# Patient Record
Sex: Female | Born: 1975 | Race: White | Hispanic: No | Marital: Married | Smoking: Never smoker
Health system: Southern US, Community
[De-identification: ages and names within clinical notes are randomized; demographics above are authoritative.]

## PROBLEM LIST (undated history)

## (undated) DIAGNOSIS — Z973 Presence of spectacles and contact lenses: Secondary | ICD-10-CM

## (undated) HISTORY — PX: NO PAST SURGERIES: SHX2092

---

## 1999-12-11 ENCOUNTER — Other Ambulatory Visit: Payer: Self-pay

## 1999-12-11 ENCOUNTER — Ambulatory Visit (HOSPITAL_COMMUNITY): Payer: Self-pay | Admitting: Obstetrics & Gynecology

## 2000-03-20 ENCOUNTER — Ambulatory Visit (HOSPITAL_COMMUNITY): Payer: Self-pay

## 2000-03-20 ENCOUNTER — Other Ambulatory Visit: Payer: Self-pay

## 2000-04-09 ENCOUNTER — Ambulatory Visit (HOSPITAL_COMMUNITY): Payer: Self-pay | Admitting: Obstetrics & Gynecology

## 2003-05-28 ENCOUNTER — Other Ambulatory Visit: Admission: RE | Admit: 2003-05-28 | Discharge: 2003-05-28 | Payer: Self-pay | Admitting: Obstetrics and Gynecology

## 2004-03-15 ENCOUNTER — Emergency Department (HOSPITAL_COMMUNITY): Admission: EM | Admit: 2004-03-15 | Discharge: 2004-03-15 | Payer: Self-pay | Admitting: Emergency Medicine

## 2004-08-01 ENCOUNTER — Other Ambulatory Visit: Admission: RE | Admit: 2004-08-01 | Discharge: 2004-08-01 | Payer: Self-pay | Admitting: Obstetrics and Gynecology

## 2005-01-11 ENCOUNTER — Other Ambulatory Visit: Admission: RE | Admit: 2005-01-11 | Discharge: 2005-01-11 | Payer: Self-pay | Admitting: Obstetrics and Gynecology

## 2007-09-09 ENCOUNTER — Encounter (INDEPENDENT_AMBULATORY_CARE_PROVIDER_SITE_OTHER): Payer: Self-pay | Admitting: Obstetrics and Gynecology

## 2007-09-09 ENCOUNTER — Inpatient Hospital Stay (HOSPITAL_COMMUNITY): Admission: AD | Admit: 2007-09-09 | Discharge: 2007-09-12 | Payer: Self-pay | Admitting: Obstetrics and Gynecology

## 2010-07-25 NOTE — Op Note (Signed)
NAMEJENNA, [name]                 ACCOUNT NO.:  [id number]   MEDICAL RECORD NO.:  [id number]          PATIENT TYPE:  INP   LOCATION:  9129                          FACILITY:  [facility]   PHYSICIAN:  Guy Sandifer. Henderson Cloud, M.D. DATE OF BIRTH:  Jun 13, 1975   DATE OF PROCEDURE:  09/09/2007  DATE OF DISCHARGE:                               OPERATIVE REPORT   PREOPERATIVE DIAGNOSIS:  Arrest of descent.   POSTOPERATIVE DIAGNOSIS:  Arrest of descent.   PROCEDURE:  Repeat low transverse cesarean section.   SURGEON:  Guy Sandifer. Henderson Cloud, MD   ANESTHESIA:  Epidural, Raul Del, MD   SPECIMENS:  Placenta to pathology.   FINDINGS:  Viable female infant, Apgars of 9 and 9 at 1 and 5 minutes  respectively.  Birth weight pending.  Arterial cord pH 7.31.   ESTIMATED BLOOD LOSS:  900 mL.   INDICATIONS AND CONSENT:  This patient is a 35 year old married white  female G2, P38 with an EDC of September 07, 2007, who is admitted on the  evening of September 08, 2007, for a induction of labor and a trial of labor  after C-section.  Prenatal care was complicated by previous low  transverse C-section for twins.  Group B strep culture was negative.  The patient progressed to complete and pushing.  She was pushing for  greater than one and half hours.  The vertex was ROA and did not descend  below +1 station.  After discussion of options, she was taken to  cesarean section.  Potential risks and complications were discussed  preoperatively including but not limited to infection, organ damage,  bleeding requiring transfusion of blood products with possible  transfusion reaction, HIV and hepatitis acquisition, DVT, PE, and  pneumonia.  All questions were answered and consent is signed on the  chart.   PROCEDURE:  The patient taken to operating room where she is identified,  epidural anesthetic is augmented to surgical level, and she is placed in  a dorsal supine position with a 15-degree left lateral wedge.  Foley  catheter is in place.  She is prepped and draped in the sterile fashion.  After testing for adequate epidural anesthesia, skin is entered through  the previous Pfannenstiel scar taking the scar out on the way in.  Dissection is carried out in layers to the peritoneum.  Peritoneum is  incised and extended superiorly and inferiorly.  The bladder is adherent  by the third of the way up on the lower uterine segment.  It is taken  down carefully without difficulty.  The lower uterine segment is fairly  thin.  The uterus is incised in a low transverse manner, and the uterine  cavity is entered bluntly.  The uterine incision is extended  cephalolaterally with the fingers.  The vertex then delivered without  difficulty and the baby is delivered.  Oronasopharynx were suctioned.  Good cry and tone is noted.  Cord is clamped and cut, and baby was  handed to the awaiting pediatrics team.  Placenta is delivered and sent  to pathology.  Uterine cavity is clean.  The lower uterine segment is  quite thin.  The uterus is closed in a running locking manner with 0  Monocryl suture, which achieves good hemostasis.  The uterus is  exteriorized and a figure-of-eight is carefully placed on the left angle  of the incision with careful palpation behind the broad ligament during  this process to protect other structures.  Good hemostasis is obtained.  Tubes and ovaries appear normal.  Uterus is replaced into the peritoneal  cavity.  Copious irrigation is carried out and all returns is clear.  Anterior peritoneum is closed in running fashion with 0 Monocryl suture,  which is also used to reapproximate the pyramidalis muscle in the  midline.  Rectus fascia is closed with a 0 PDS suture starting at each  angle and meeting in the middle.  The subcutaneous tissue is first  mobilized with a cautery to takedown the old scar and then the  subcutaneous layer is closed with interrupted 2-0 plain suture.  Skin is  closed with  clips.  All sponge, instruments, and needle counts were  correct, and the patient is transferred to recovery room in stable  condition.      Guy Sandifer Henderson Cloud, M.D.  Electronically Signed     JET/MEDQ  D:  09/09/2007  T:  09/10/2007  Job:  [id number]

## 2010-07-28 NOTE — Discharge Summary (Signed)
NAMEDYNESHA, WOOLEN                 ACCOUNT NO.:  [id number]   MEDICAL RECORD NO.:  [id number]          PATIENT TYPE:  INP   LOCATION:  9129                          FACILITY:  [facility]   PHYSICIAN:  Michelle L. Grewal, M.D.DATE OF BIRTH:  November 11, 1975   DATE OF ADMISSION:  09/09/2007  DATE OF DISCHARGE:  09/12/2007                               DISCHARGE SUMMARY   ADMITTING DIAGNOSES:  1. Intrauterine pregnancy at term.  2. Spontaneous onset of labor.  3. Previous cesarean section for twins, desires vaginal birth after      cesarean.   DISCHARGE DIAGNOSES:  1. Status post low transverse cesarean section secondary to arrest of      descent.  2. Viable female infant.   PROCEDURE:  Repeat low transverse cesarean section.   REASON FOR ADMISSION:  Please see written H&P.   HOSPITAL COURSE:  The patient is a 35 year old white married female  gravida 2, para 1 that was admitted to [facility] for a  VBAC.  The patient had had a previous cesarean delivery secondary to  twin delivery.  Group B beta strep culture was negative.  On admission,  cervix was dilated 2 cm, 80% effaced, vertex at -3 station.  Contractions were approximately every 3-5 minutes.  Fetal heart tones  continued to be reactive.  Intrauterine pressure catheter was placed and  the patient was started on low-dose Pitocin protocol for augmentation of  her labor.  The patient did progress to 8 cm completely effaced and 0 to  -1 station.  Shortly thereafter she did achieve full dilatation and  after pushing for approximately 1-1/2 hours with no further progress in  station, decision was made to proceed with a primary low transverse  cesarean section.  The patient was then transferred to the operating  room where epidural was dosed to an adequate surgical level.  A low  transverse incision was made with delivery of a viable female infant  weighing 7 pounds 3 ounces with Apgars of 9 at 1 minute and 9 at 5  minutes.   Arterial cord pH of 7.31.  The patient tolerated procedure  well and was taken to the recovery room in stable condition.  On  postoperative day #1, the patient was without complaint.  Vital signs  were stable.  She was afebrile.  Fundus firm and nontender.  Abdominal  dressing was noted to be clean, dry, and intact.  Laboratory findings  revealed hemoglobin 11.2, platelet count 182,000, WBC count of 15.4.  On  postoperative day #2, the patient was without complaint.  Vital signs  remained stable.  She was afebrile.  Fundus firm and nontender.  Incision was clean, dry, and intact.  She was ambulating well and  voiding without difficulty.  On postoperative day #3, the patient was  without complaint.  Vital signs remained stable.  She was afebrile.  Fundus firm and nontender.  Incision was clean, dry, and intact.  Staples removed, and the patient was later discharged to home.   CONDITION ON DISCHARGE:  Stable.   DIET:  Regular as tolerated.  ACTIVITY:  No heavy lifting.  No driving x2 weeks.  No vaginal entry.   FOLLOW UP:  The patient to follow up in the office in 1-2 weeks for an  incision check.  She is to call for temperature greater than 100  degrees, persistent nausea, vomiting, heavy vaginal bleeding, and/or  redness or drainage from the incisional site.   DISCHARGE MEDICATIONS:  1. Tylox #30 one p.o. every 4-6 hours p.r.n.  2. Motrin 600 mg every 6 hours.  3. Prenatal vitamins one p.o. daily.  4. Colace one p.o. daily p.r.n.      Julio Sicks, N.P.      Stann Mainland. Vincente Poli, M.D.  Electronically Signed    CC/MEDQ  D:  10/15/2007  T:  10/15/2007  Job:  [id number]

## 2010-12-07 LAB — CBC
HCT: 38.8
HCT: 32.3 — ABNORMAL LOW
Hemoglobin: 13.3
Hemoglobin: 11.2 — ABNORMAL LOW
MCHC: 34.5
MCV: 96.6
RBC: 4.03
RBC: 3.35 — ABNORMAL LOW
RDW: 13.2
WBC: 13.6 — ABNORMAL HIGH
WBC: 15.4 — ABNORMAL HIGH

## 2012-05-15 ENCOUNTER — Other Ambulatory Visit: Payer: Self-pay | Admitting: Obstetrics and Gynecology

## 2013-05-26 ENCOUNTER — Other Ambulatory Visit: Payer: Self-pay | Admitting: Obstetrics and Gynecology

## 2013-06-25 ENCOUNTER — Other Ambulatory Visit: Payer: Self-pay | Admitting: Obstetrics and Gynecology

## 2013-06-25 DIAGNOSIS — R928 Other abnormal and inconclusive findings on diagnostic imaging of breast: Secondary | ICD-10-CM

## 2013-07-06 ENCOUNTER — Ambulatory Visit
Admission: RE | Admit: 2013-07-06 | Discharge: 2013-07-06 | Disposition: A | Discharge status: Home / Self Care | Payer: 59 | Source: Ambulatory Visit | Attending: Obstetrics and Gynecology | Admitting: Obstetrics and Gynecology

## 2013-07-06 ENCOUNTER — Encounter (INDEPENDENT_AMBULATORY_CARE_PROVIDER_SITE_OTHER): Payer: Self-pay

## 2013-07-06 DIAGNOSIS — R928 Other abnormal and inconclusive findings on diagnostic imaging of breast: Secondary | ICD-10-CM

## 2013-08-03 ENCOUNTER — Ambulatory Visit (INDEPENDENT_AMBULATORY_CARE_PROVIDER_SITE_OTHER): Payer: 59 | Admitting: Internal Medicine

## 2013-08-03 ENCOUNTER — Ambulatory Visit: Payer: 59

## 2013-08-03 VITALS — BP 102/60 | HR 56 | Temp 98.3°F | Resp 16 | Ht 60.5 in | Wt 102.0 lb

## 2013-08-03 DIAGNOSIS — S62609A Fracture of unspecified phalanx of unspecified finger, initial encounter for closed fracture: Secondary | ICD-10-CM

## 2013-08-03 DIAGNOSIS — M79641 Pain in right hand: Secondary | ICD-10-CM

## 2013-08-03 DIAGNOSIS — M79609 Pain in unspecified limb: Secondary | ICD-10-CM

## 2013-08-03 MED ORDER — HYDROCODONE-ACETAMINOPHEN 5-325 MG PO TABS: 1.0000 | ORAL_TABLET | Freq: Four times a day (QID) | ORAL | Status: DC | PRN

## 2013-08-03 NOTE — Progress Notes (Addendum)
   Subjective:  This chart was scribed for Dr. Tami Lin, MD by Ludger Nutting, ED Scribe. This patient was seen in room 11 and the patient's care was started 1:39 PM.    Patient ID: [name], female    DOB: Apr 14, 1975, 38 y.o.   MRN: [id number]  HPI HPI Comments: [name] is a 38 y.o. female who presents to [facility] complaining of a right hand injury that occurred PTA. Patient states she accidentally grabbed the wrong side of a drill bit and suffered a twisting injury. She has constant, unchanged right index finger pain and right middle finger pain with an associated wound. She denies active bleeding. She denies numbness or weakness. She denies any other injuries.    Review of Systems  Musculoskeletal: Positive for arthralgias.  Skin: Positive for wound.       Objective:   Physical Exam  Nursing note and vitals reviewed. Constitutional: She is oriented to person, place, and time. She appears well-developed and well-nourished.  HENT:  Head: Normocephalic and atraumatic.  Cardiovascular: Normal rate.   Pulmonary/Chest: Effort normal.  Abdominal: She exhibits no distension.  Musculoskeletal:  Right hand: Right 3rd finger is swollen over proximal phalanx. Has rotation abnormality of that 3rd digit. Small abrasion on the volar aspect of the index MCP.   Neurological: She is alert and oriented to person, place, and time.  Skin: Skin is warm and dry.  Psychiatric: She has a normal mood and affect.    [facility] reading (PRIMARY) by  Dr. Laney Pastor oblique fracture of the third proximal phalanx with some translational dislocation  Tylenol cod elixir 20c=nausea      Assessment & Plan:   I have completed the patient encounter in its entirety as documented by the scribe, with editing by me where necessary. [name] P. Laney Pastor, M.D.  Hand pain, right - secondary to oblique fracture of third right proximal phalanx  Splinted in position of comfort Discussed with Dr. Fredna Dow who will  see the patient tomorrow Meds ordered this encounter  Medications  . levonorgestrel (MIRENA) 20 MCG/24HR IUD    Sig: 1 each by Intrauterine route once.  Marland Kitchen HYDROcodone-acetaminophen (NORCO/VICODIN) 5-325 MG per tablet    Sig: Take 1-2 tablets by mouth every 6 (six) hours as needed for moderate pain.    Dispense:  30 tablet    Refill:  0   Ice/elevate

## 2013-08-04 ENCOUNTER — Other Ambulatory Visit: Payer: Self-pay | Admitting: Orthopedic Surgery

## 2013-08-04 ENCOUNTER — Encounter (HOSPITAL_BASED_OUTPATIENT_CLINIC_OR_DEPARTMENT_OTHER): Payer: Self-pay | Admitting: *Deleted

## 2013-08-04 NOTE — Progress Notes (Signed)
No labs needed

## 2013-08-05 ENCOUNTER — Encounter (HOSPITAL_BASED_OUTPATIENT_CLINIC_OR_DEPARTMENT_OTHER): Payer: Self-pay | Admitting: Orthopedic Surgery

## 2013-08-05 ENCOUNTER — Ambulatory Visit (HOSPITAL_BASED_OUTPATIENT_CLINIC_OR_DEPARTMENT_OTHER): Payer: 59 | Admitting: Anesthesiology

## 2013-08-05 ENCOUNTER — Encounter (HOSPITAL_BASED_OUTPATIENT_CLINIC_OR_DEPARTMENT_OTHER)
Admission: RE | Disposition: A | Discharge status: Home / Self Care | Payer: Self-pay | Source: Ambulatory Visit | Attending: Orthopedic Surgery

## 2013-08-05 ENCOUNTER — Ambulatory Visit (HOSPITAL_BASED_OUTPATIENT_CLINIC_OR_DEPARTMENT_OTHER)
Admission: RE | Admit: 2013-08-05 | Discharge: 2013-08-05 | Disposition: A | Discharge status: Home / Self Care | Payer: 59 | Source: Ambulatory Visit | Attending: Orthopedic Surgery | Admitting: Orthopedic Surgery

## 2013-08-05 ENCOUNTER — Encounter (HOSPITAL_BASED_OUTPATIENT_CLINIC_OR_DEPARTMENT_OTHER): Payer: 59 | Admitting: Anesthesiology

## 2013-08-05 DIAGNOSIS — Z882 Allergy status to sulfonamides status: Secondary | ICD-10-CM | POA: Insufficient documentation

## 2013-08-05 DIAGNOSIS — W312XXA Contact with powered woodworking and forming machines, initial encounter: Secondary | ICD-10-CM | POA: Insufficient documentation

## 2013-08-05 DIAGNOSIS — Y929 Unspecified place or not applicable: Secondary | ICD-10-CM | POA: Insufficient documentation

## 2013-08-05 DIAGNOSIS — IMO0002 Reserved for concepts with insufficient information to code with codable children: Secondary | ICD-10-CM | POA: Insufficient documentation

## 2013-08-05 HISTORY — DX: Presence of spectacles and contact lenses: Z97.3

## 2013-08-05 HISTORY — PX: OPEN REDUCTION INTERNAL FIXATION (ORIF) PROXIMAL PHALANX: SHX6235

## 2013-08-05 LAB — POCT HEMOGLOBIN-HEMACUE: Hemoglobin: 11.7 g/dL — ABNORMAL LOW (ref 12.0–15.0)

## 2013-08-05 SURGERY — OPEN REDUCTION INTERNAL FIXATION (ORIF) PROXIMAL PHALANX
Anesthesia: Regional | Site: Finger | Laterality: Right

## 2013-08-05 MED ORDER — LACTATED RINGERS IV SOLN
INTRAVENOUS | Status: DC
  Administered 2013-08-05: 16:00:00 via INTRAVENOUS

## 2013-08-05 MED ORDER — CEFAZOLIN SODIUM-DEXTROSE 2-3 GM-% IV SOLR
INTRAVENOUS | Status: AC
  Filled 2013-08-05: qty 50

## 2013-08-05 MED ORDER — ONDANSETRON HCL 4 MG/2ML IJ SOLN: 4.0000 mg | Freq: Four times a day (QID) | INTRAMUSCULAR | Status: DC | PRN

## 2013-08-05 MED ORDER — FENTANYL CITRATE 0.05 MG/ML IJ SOLN
INTRAMUSCULAR | Status: AC
  Filled 2013-08-05: qty 2

## 2013-08-05 MED ORDER — FENTANYL CITRATE 0.05 MG/ML IJ SOLN: 25.0000 ug | INTRAMUSCULAR | Status: DC | PRN

## 2013-08-05 MED ORDER — CHLORHEXIDINE GLUCONATE 4 % EX LIQD: 60.0000 mL | Freq: Once | CUTANEOUS | Status: DC

## 2013-08-05 MED ORDER — LIDOCAINE HCL (CARDIAC) 20 MG/ML IV SOLN
INTRAVENOUS | Status: DC | PRN
  Administered 2013-08-05: 70 mg via INTRAVENOUS

## 2013-08-05 MED ORDER — 0.9 % SODIUM CHLORIDE (POUR BTL) OPTIME
TOPICAL | Status: DC | PRN
  Administered 2013-08-05: 200 mL

## 2013-08-05 MED ORDER — FENTANYL CITRATE 0.05 MG/ML IJ SOLN
INTRAMUSCULAR | Status: AC
  Filled 2013-08-05: qty 6

## 2013-08-05 MED ORDER — HYDROCODONE-ACETAMINOPHEN 5-325 MG PO TABS: 1.0000 | ORAL_TABLET | Freq: Four times a day (QID) | ORAL | Status: DC | PRN

## 2013-08-05 MED ORDER — MIDAZOLAM HCL 2 MG/2ML IJ SOLN
INTRAMUSCULAR | Status: AC
  Filled 2013-08-05: qty 2

## 2013-08-05 MED ORDER — CEFAZOLIN SODIUM-DEXTROSE 2-3 GM-% IV SOLR
2.0000 g | INTRAVENOUS | Status: AC
  Administered 2013-08-05: 2 g via INTRAVENOUS

## 2013-08-05 MED ORDER — OXYCODONE HCL 5 MG/5ML PO SOLN: 5.0000 mg | Freq: Once | ORAL | Status: DC | PRN

## 2013-08-05 MED ORDER — FENTANYL CITRATE 0.05 MG/ML IJ SOLN
50.0000 ug | INTRAMUSCULAR | Status: DC | PRN
  Administered 2013-08-05: 100 ug via INTRAVENOUS

## 2013-08-05 MED ORDER — DEXAMETHASONE SODIUM PHOSPHATE 4 MG/ML IJ SOLN
INTRAMUSCULAR | Status: DC | PRN
  Administered 2013-08-05: 10 mg via INTRAVENOUS

## 2013-08-05 MED ORDER — PROPOFOL 10 MG/ML IV BOLUS
INTRAVENOUS | Status: DC | PRN
  Administered 2013-08-05: 150 mg via INTRAVENOUS

## 2013-08-05 MED ORDER — MIDAZOLAM HCL 2 MG/2ML IJ SOLN
1.0000 mg | INTRAMUSCULAR | Status: DC | PRN
  Administered 2013-08-05: 2 mg via INTRAVENOUS

## 2013-08-05 MED ORDER — BUPIVACAINE HCL (PF) 0.25 % IJ SOLN
INTRAMUSCULAR | Status: AC
  Filled 2013-08-05: qty 30

## 2013-08-05 MED ORDER — OXYCODONE HCL 5 MG PO TABS: 5.0000 mg | ORAL_TABLET | Freq: Once | ORAL | Status: DC | PRN

## 2013-08-05 MED ORDER — BUPIVACAINE-EPINEPHRINE (PF) 0.5% -1:200000 IJ SOLN
INTRAMUSCULAR | Status: DC | PRN
  Administered 2013-08-05: 30 mL

## 2013-08-05 MED ORDER — ONDANSETRON HCL 4 MG/2ML IJ SOLN
INTRAMUSCULAR | Status: DC | PRN
  Administered 2013-08-05: 4 mg via INTRAVENOUS

## 2013-08-05 MED ORDER — CEFAZOLIN SODIUM-DEXTROSE 2-3 GM-% IV SOLR: 2.0000 g | INTRAVENOUS | Status: DC

## 2013-08-05 SURGICAL SUPPLY — 57 items
BANDAGE COBAN STERILE 2 (GAUZE/BANDAGES/DRESSINGS) IMPLANT
BIT DRILL 1.0 (BIT) ×2
BIT DRILL 1.0MM (BIT) ×1
BIT DRILL 1.0X50 (BIT) IMPLANT
BLADE MINI RND TIP GREEN BEAV (BLADE) ×2 IMPLANT
BLADE SURG 15 STRL LF DISP TIS (BLADE) ×1 IMPLANT
BLADE SURG 15 STRL SS (BLADE) ×3
BNDG CMPR 9X4 STRL LF SNTH (GAUZE/BANDAGES/DRESSINGS) ×1
BNDG COHESIVE 1X5 TAN STRL LF (GAUZE/BANDAGES/DRESSINGS) IMPLANT
BNDG COHESIVE 3X5 TAN STRL LF (GAUZE/BANDAGES/DRESSINGS) ×3 IMPLANT
BNDG ESMARK 4X9 LF (GAUZE/BANDAGES/DRESSINGS) ×3 IMPLANT
BNDG GAUZE ELAST 4 BULKY (GAUZE/BANDAGES/DRESSINGS) ×2 IMPLANT
CHLORAPREP W/TINT 26ML (MISCELLANEOUS) ×3 IMPLANT
CORDS BIPOLAR (ELECTRODE) ×3 IMPLANT
COVER MAYO STAND STRL (DRAPES) ×3 IMPLANT
COVER TABLE BACK 60X90 (DRAPES) ×3 IMPLANT
CUFF TOURNIQUET SINGLE 18IN (TOURNIQUET CUFF) ×2 IMPLANT
DECANTER SPIKE VIAL GLASS SM (MISCELLANEOUS) IMPLANT
DRAPE EXTREMITY T 121X128X90 (DRAPE) ×3 IMPLANT
DRAPE OEC MINIVIEW 54X84 (DRAPES) ×3 IMPLANT
DRAPE SURG 17X23 STRL (DRAPES) ×3 IMPLANT
GAUZE SPONGE 4X4 12PLY STRL (GAUZE/BANDAGES/DRESSINGS) ×3 IMPLANT
GAUZE XEROFORM 1X8 LF (GAUZE/BANDAGES/DRESSINGS) ×3 IMPLANT
GLOVE BIOGEL M STRL SZ7.5 (GLOVE) ×2 IMPLANT
GLOVE BIOGEL PI IND STRL 8 (GLOVE) IMPLANT
GLOVE BIOGEL PI IND STRL 8.5 (GLOVE) ×1 IMPLANT
GLOVE BIOGEL PI INDICATOR 8 (GLOVE) ×2
GLOVE BIOGEL PI INDICATOR 8.5 (GLOVE) ×2
GLOVE EXAM NITRILE MD LF STRL (GLOVE) ×3 IMPLANT
GLOVE SURG ORTHO 8.0 STRL STRW (GLOVE) ×3 IMPLANT
GOWN STRL REUS W/ TWL LRG LVL3 (GOWN DISPOSABLE) IMPLANT
GOWN STRL REUS W/ TWL XL LVL3 (GOWN DISPOSABLE) IMPLANT
GOWN STRL REUS W/TWL LRG LVL3 (GOWN DISPOSABLE)
GOWN STRL REUS W/TWL XL LVL3 (GOWN DISPOSABLE) ×8 IMPLANT
NEEDLE 27GAX1X1/2 (NEEDLE) IMPLANT
NS IRRIG 1000ML POUR BTL (IV SOLUTION) ×3 IMPLANT
PACK BASIN DAY SURGERY FS (CUSTOM PROCEDURE TRAY) ×3 IMPLANT
PAD CAST 3X4 CTTN HI CHSV (CAST SUPPLIES) ×1 IMPLANT
PADDING CAST ABS 4INX4YD NS (CAST SUPPLIES) ×2
PADDING CAST ABS COTTON 4X4 ST (CAST SUPPLIES) ×1 IMPLANT
PADDING CAST COTTON 3X4 STRL (CAST SUPPLIES) ×3
SCREW SELF TAP CORTEX 1.3 10MM (Screw) ×2 IMPLANT
SCREW SELF TAP CORTEX 1.3 9MM (Screw) ×4 IMPLANT
SLEEVE SCD COMPRESS KNEE MED (MISCELLANEOUS) ×2 IMPLANT
SPLINT PLASTER CAST XFAST 3X15 (CAST SUPPLIES) IMPLANT
SPLINT PLASTER XTRA FASTSET 3X (CAST SUPPLIES) ×20
STOCKINETTE 4X48 STRL (DRAPES) ×3 IMPLANT
SUT CHROMIC 5 0 P 3 (SUTURE) IMPLANT
SUT CHROMIC 6 0 PS 4 (SUTURE) ×2 IMPLANT
SUT MERSILENE 4 0 P 3 (SUTURE) ×2 IMPLANT
SUT VICRYL 4-0 PS2 18IN ABS (SUTURE) IMPLANT
SUT VICRYL RAPID 5 0 P 3 (SUTURE) ×2 IMPLANT
SUT VICRYL RAPIDE 4/0 PS 2 (SUTURE) IMPLANT
SYR BULB 3OZ (MISCELLANEOUS) ×3 IMPLANT
SYR CONTROL 10ML LL (SYRINGE) IMPLANT
TOWEL OR 17X24 6PK STRL BLUE (TOWEL DISPOSABLE) ×3 IMPLANT
UNDERPAD 30X30 INCONTINENT (UNDERPADS AND DIAPERS) ×3 IMPLANT

## 2013-08-05 NOTE — Anesthesia Postprocedure Evaluation (Signed)
Anesthesia Post Note  Patient: [name]  Procedure(s) Performed: Procedure(s) (LRB): OPEN REDUCTION INTERNAL FIXATION (ORIF) PROXIMAL PHALANX RIGHT MIDDLE FINGER (Right)  Anesthesia type: General  Patient location: PACU  Post pain: Pain level controlled and Adequate analgesia  Post assessment: Post-op Vital signs reviewed, Patient's Cardiovascular Status Stable, Respiratory Function Stable, Patent Airway and Pain level controlled  Last Vitals:  Filed Vitals:   08/05/13 1700  BP: 105/60  Pulse: 95  Temp:   Resp: 15    Post vital signs: Reviewed and stable  Level of consciousness: awake, alert  and oriented  Complications: No apparent anesthesia complications

## 2013-08-05 NOTE — Anesthesia Procedure Notes (Addendum)
Anesthesia Regional Block:  Supraclavicular block  Pre-Anesthetic Checklist: ,, timeout performed, Correct Patient, Correct Site, Correct Laterality, Correct Procedure, Correct Position, site marked, Risks and benefits discussed,  Surgical consent,  Pre-op evaluation,  At surgeon's request and post-op pain management  Laterality: Right  Prep: chloraprep       Needles:  Injection technique: Single-shot  Needle Type: Echogenic Stimulator Needle     Needle Length: 5cm 5 cm Needle Gauge: 22 and 22 G    Additional Needles:  Procedures: ultrasound guided (picture in chart) and nerve stimulator Supraclavicular block  Nerve Stimulator or Paresthesia:  Response: biceps flexion, 0.45 mA,   Additional Responses:   Narrative:  Start time: 08/05/2013 3:25 PM End time: 08/05/2013 3:32 PM Injection made incrementally with aspirations every 5 mL.  Performed by: Personally  Anesthesiologist: Dr Marcie Bal  Additional Notes: Functioning IV was confirmed and monitors were applied.  A 44mm 22ga Arrow echogenic stimulator needle was used. Sterile prep and drape,hand hygiene and sterile gloves were used.  Negative aspiration and negative test dose prior to incremental administration of local anesthetic. The patient tolerated the procedure well.  Ultrasound guidance: relevent anatomy identified, needle position confirmed, local anesthetic spread visualized around nerve(s), vascular puncture avoided.  Image printed for medical record.    Procedure Name: LMA Insertion Date/Time: 08/05/2013 3:57 PM Performed by: Lyndee Leo Pre-anesthesia Checklist: Patient identified, Emergency Drugs available, Suction available and Patient being monitored Patient Re-evaluated:Patient Re-evaluated prior to inductionOxygen Delivery Method: Circle System Utilized Preoxygenation: Pre-oxygenation with 100% oxygen Intubation Type: IV induction Ventilation: Mask ventilation without difficulty LMA: LMA  inserted LMA Size: 4.0 Number of attempts: 1 Airway Equipment and Method: bite block Placement Confirmation: positive ETCO2 Tube secured with: Tape Dental Injury: Teeth and Oropharynx as per pre-operative assessment

## 2013-08-05 NOTE — Progress Notes (Signed)
Assisted Dr. Hodierne with right, ultrasound guided, supraclavicular block. Side rails up, monitors on throughout procedure. See vital signs in flow sheet. Tolerated Procedure well.  

## 2013-08-05 NOTE — H&P (Signed)
[name] is a 38 year-old right-hand dominant female who suffered an injury she suffered to her right middle finger. She was using a drill when she accidentally turned it on while holding the key, this resulted in a fracture of her proximal phalanx, spiral in nature, right middle finger. She has no prior history of injury, no history of diabetes, thyroid problems, arthritis or gout.  She was seen by Dr. Laney Pastor and placed in a splint and referred. She complains of moderate discomfort, throbbing and aching in nature with a feeling of swelling, numbness and weakness.  Activity makes it worse.  Rest, ice and elevation have helped.  She has been on Vicodin and ibuprofen.   ALLERGIES:     Sulfa.  MEDICATIONS:    Mirena IUD.  SURGICAL HISTORY:     C-sections (2)  FAMILY MEDICAL HISTORY:    Positive for diabetes, heart disease, high blood pressure and arthritis.  She is advised the importance of being tested for diabetes which she has not been in a number of years.   SOCIAL HISTORY:     She does not smoke or drink.  She is married and self-employed.    REVIEW OF SYSTEMS:    Positive for glasses, easy bruising, otherwise negative. [name] is an 38 y.o. female.   Chief Complaint: Fracture Right middle finger HPI: see above  Past Medical History  Diagnosis Date  . Wears glasses     Past Surgical History  Procedure Laterality Date  . No past surgeries      Family History  Problem Relation Age of Onset  . Heart disease Father   . Hyperlipidemia Father   . Hypertension Father    Social History:  reports that she has never smoked. She does not have any smokeless tobacco history on file. She reports that she does not drink alcohol or use illicit drugs.  Allergies:  Allergies  Allergen Reactions  . Sulfa Antibiotics Rash    SOB Fever    No prescriptions prior to admission    No results found for this or any previous visit (from the past 48 hour(s)).  Dg Hand Complete  Right  08/03/2013   CLINICAL DATA:  Injury.  Right hand plane.  EXAM: RIGHT HAND - COMPLETE 3+ VIEW  COMPARISON:  None.  FINDINGS: Oblique fracture of the proximal phalanx of the middle finger. Fracture extends from the radial midshaft to the distal ulnar aspect. Fracture is mildly displaced, 4 mm in a radial direction. No significant comminution. Fracture does not involve the PIP joint articular surface.  No other fractures.  No dislocation.  IMPRESSION: Fracture proximal phalanx of the right middle finger as described.   Electronically Signed   By: Lajean Manes M.D.   On: 08/03/2013 15:46     Pertinent items are noted in HPI.  Height 5' 0.5" (1.537 m), weight 46.267 kg (102 lb), last menstrual period 06/24/2013.  General appearance: alert, cooperative and appears stated age Head: Normocephalic, without obvious abnormality Neck: no JVD Resp: clear to auscultation bilaterally Cardio: regular rate and rhythm, S1, S2 normal, no murmur, click, rub or gallop GI: soft, non-tender; bowel sounds normal; no masses,  no organomegaly Extremities: swellinf right hand Pulses: 2+ and symmetric Skin: Skin color, texture, turgor normal. No rashes or lesions Neurologic: Alert and oriented X 3, normal strength and tone. Normal symmetric reflexes. Normal coordination and gait Incision/Wound: na  Assessment/Plan RADIOGRAPHS:     X-rays reveal a spiral oblique fracture of proximal  phalanx, right middle finger.    DIAGNOSIS:      Spiral fracture right middle finger proximal phalanx.  RECOMMENDATIONS/PLAN:     This will require ORIF.  The pre, peri and postoperative course were discussed along with the risks and complications.  The patient is aware there is no guarantee with the surgery, possibility of infection, recurrence, injury to arteries, nerves, tendons, incomplete relief of symptoms and dystrophy.  This will be scheduled as an outpatient under regional anesthesia.   [name] 08/05/2013, 12:18  PM

## 2013-08-05 NOTE — Anesthesia Preprocedure Evaluation (Signed)
Anesthesia Evaluation  Patient identified by MRN, date of birth, ID band Patient awake    Reviewed: Allergy & Precautions, H&P , NPO status , Patient's Chart, lab work & pertinent test results  Airway Mallampati: II  Neck ROM: full    Dental   Pulmonary neg pulmonary ROS,          Cardiovascular negative cardio ROS      Neuro/Psych    GI/Hepatic   Endo/Other    Renal/GU      Musculoskeletal   Abdominal   Peds  Hematology   Anesthesia Other Findings   Reproductive/Obstetrics                           Anesthesia Physical Anesthesia Plan  ASA: I  Anesthesia Plan: General and Regional   Post-op Pain Management: MAC Combined w/ Regional for Post-op pain   Induction: Intravenous  Airway Management Planned: LMA  Additional Equipment:   Intra-op Plan:   Post-operative Plan:   Informed Consent: I have reviewed the patients History and Physical, chart, labs and discussed the procedure including the risks, benefits and alternatives for the proposed anesthesia with the patient or authorized representative who has indicated his/her understanding and acceptance.     Plan Discussed with: CRNA, Anesthesiologist and Surgeon  Anesthesia Plan Comments:         Anesthesia Quick Evaluation

## 2013-08-05 NOTE — Op Note (Addendum)
Dictation Number [id number] Intra-operative fluoroscopic images in the AP, lateral, and oblique views were taken and evaluated by myself.  Reduction and hardware placement were confirmed.

## 2013-08-05 NOTE — Transfer of Care (Signed)
Immediate Anesthesia Transfer of Care Note  Patient: [name]  Procedure(s) Performed: Procedure(s): OPEN REDUCTION INTERNAL FIXATION (ORIF) PROXIMAL PHALANX RIGHT MIDDLE FINGER (Right)  Patient Location: PACU  Anesthesia Type:GA combined with regional for post-op pain  Level of Consciousness: awake, sedated and patient cooperative  Airway & Oxygen Therapy: Patient Spontanous Breathing and Patient connected to face mask oxygen  Post-op Assessment: Report given to PACU RN and Post -op Vital signs reviewed and stable  Post vital signs: Reviewed and stable  Complications: No apparent anesthesia complications

## 2013-08-05 NOTE — Discharge Instructions (Addendum)

## 2013-08-05 NOTE — Brief Op Note (Signed)
08/05/2013  4:39 PM  PATIENT:  Lezley L Baillargeon  38 y.o. female  PRE-OPERATIVE DIAGNOSIS:  FRACTURE RIGHT MIDDLE FINGER PROXIMAL PHALANX  POST-OPERATIVE DIAGNOSIS:  FRACTURE RIGHT MIDDLE FINGER PROXIMAL PHALANX  PROCEDURE:  Procedure(s): OPEN REDUCTION INTERNAL FIXATION (ORIF) PROXIMAL PHALANX RIGHT MIDDLE FINGER (Right)  SURGEON:  Surgeon(s) and Role:    * Wynonia Sours, MD - Primary    * Schuyler Amor, MD - Assisting  PHYSICIAN ASSISTANT:   ASSISTANTS: Marzetta Merino, MD   ANESTHESIA:   regional and general  EBL:  Total I/O In: 500 [I.V.:500] Out: -   BLOOD ADMINISTERED:none  DRAINS: none   LOCAL MEDICATIONS USED:  NONE  SPECIMEN:  No Specimen  DISPOSITION OF SPECIMEN:  N/A  COUNTS:  YES  TOURNIQUET:   Total Tourniquet Time Documented: Upper Arm (Right) - 31 minutes Total: Upper Arm (Right) - 31 minutes   DICTATION: .Other Dictation: Dictation Number [id number]  PLAN OF CARE: Discharge to home after PACU  PATIENT DISPOSITION:  PACU - hemodynamically stable.

## 2013-08-06 ENCOUNTER — Encounter (HOSPITAL_BASED_OUTPATIENT_CLINIC_OR_DEPARTMENT_OTHER): Payer: Self-pay | Admitting: Orthopedic Surgery

## 2013-08-06 NOTE — Op Note (Signed)
[name], [name]                 ACCOUNT NO.:  [id number]  MEDICAL RECORD NO.:  [id number]  LOCATION:                                 FACILITY:  PHYSICIAN:  Daryll Brod, M.D.            DATE OF BIRTH:  DATE OF PROCEDURE:  08/05/2013 DATE OF DISCHARGE:                              OPERATIVE REPORT   PREOPERATIVE DIAGNOSIS:  Fracture, proximal phalanx, right middle finger.  POSTOPERATIVE DIAGNOSIS:  Fracture, proximal phalanx, right middle finger.  OPERATION:  Open reduction and internal fixation, right middle finger proximal phalanx fracture.  SURGEON:  Daryll Brod, M.D.  ASSISTANT:  Sheral Apley. Burney Gauze, M.D.  ANESTHESIA:  Supraclavicular block, general.  ANESTHESIOLOGIST:  Albertha Ghee, MD.  HISTORY:  The patient is a 38 year old female, who suffered a fracture of her right middle finger proximal phalanx when her finger was caught, trying to put the key in a drill,, accidentally turning the drill line and she suffered the fracture, was placed in a splint and referred.  She is admitted now for open reduction and internal fixation.  Pre, peri, and postoperative course have been discussed along with risks and complications.  She is aware that there is no guarantee with the surgery; possibility of infection; recurrence of injury to arteries, nerves, tendons; incomplete relief of symptoms and dystrophy.  In the preoperative area, the patient was seen, the extremity marked by both patient and surgeon, and antibiotic given.  DESCRIPTION OF PROCEDURE:  The patient was brought to the operating room, a supraclavicular block had been carried out in the preoperative area.  General anesthetic was given.  She was prepped using ChloraPrep, supine position, right arm free.  A 3-minute dry time was allowed.  Time- out taken, confirming the patient and procedure.  A longitudinal incision was made over the proximal phalanx, right middle finger, carried down through the subcutaneous  tissue.  Bleeders were electrocauterized with bipolar.  The extensor tendon was split longitudinally.  The periosteum incised on the ulnar aspect.  The periosteum was elevated, the fracture immediately identified.  This was cleared with a curettes of clot and granulation tissue with some difficulty.  The fracture was manipulated, reduced and clamped.  X-rays confirmed positioning in an anatomic position.  Three 1.3-mm screws were then placed, 9, 9 and 10 mm in size.  These firmly fixed her fracture in place, these were placed after drilling a 1 mm drill hole.  The wound was copiously irrigated with saline.  The periosteum was closed with running 6-0 chromic, the extensor tendon was closed with running 4-0 Mersilene.  The skin was repaired with 5-0 Vicryl Rapide.  X-rays confirmed reduction of the fracture in AP and lateral direction prior to closure.  The patient was placed in the dorsal palmar hand-based splint, tolerated the procedure well.  On deflation of the tourniquet, all fingers were immediately pinked.  The tourniquet was inflated prior to the procedure after exsanguination with an Esmarch bandage and inflated to 250 mmHg.  The patient tolerated the procedure well, was taken to the recovery room for observation in satisfactory condition. She will be discharged to home to return to  the [facility] in 1 week, on Vicodin.          ______________________________ Daryll Brod, M.D.     GK/MEDQ  D:  08/05/2013  T:  08/06/2013  Job:  [id number]

## 2014-06-06 ENCOUNTER — Ambulatory Visit (INDEPENDENT_AMBULATORY_CARE_PROVIDER_SITE_OTHER): Payer: 59 | Admitting: Physician Assistant

## 2014-06-06 VITALS — BP 117/66 | HR 64 | Temp 97.7°F | Resp 16 | Ht 60.5 in | Wt 105.6 lb

## 2014-06-06 DIAGNOSIS — J029 Acute pharyngitis, unspecified: Secondary | ICD-10-CM

## 2014-06-06 LAB — POCT RAPID STREP A (OFFICE): RAPID STREP A SCREEN: NEGATIVE

## 2014-06-06 NOTE — Progress Notes (Signed)
   Subjective:    Patient ID: [name], female    DOB: 1975/11/30, 39 y.o.   MRN: [id number]  HPI Pt presents to clinic with 2 day h/o sore throat that is getting worse.  She is worried about strep because her son was diagnosed by rapid strep 4 days ago.  She has seasonal allergies and will sometimes get a sore throat but it always resolves after a little bit of her being up in the am where her current sore throat is bad all day.  The motrin and apple cider vinegar gargles have helped her pain slightly.  She has no congestion or cough and no F/C.  Review of Systems  Constitutional: Negative for fever and chills.  HENT: Positive for sore throat. Negative for congestion, postnasal drip and rhinorrhea.   Gastrointestinal: Positive for diarrhea. Negative for nausea and vomiting.  Allergic/Immunologic: Positive for environmental allergies.   There are no active problems to display for this patient.  Prior to Admission medications   Medication Sig Start Date End Date Taking? Authorizing Provider  levonorgestrel (MIRENA) 20 MCG/24HR IUD 1 each by Intrauterine route once.   Yes Historical Provider, MD   Allergies  Allergen Reactions  . Sulfa Antibiotics Rash    SOB Fever    Medications, allergies, past medical history, surgical history, family history, social history and problem list reviewed and updated.      Objective:   Physical Exam  Constitutional: She appears well-developed and well-nourished.  BP 117/66 mmHg  Pulse 64  Temp(Src) 97.7 F (36.5 C) (Oral)  Resp 16  Ht 5' 0.5" (1.537 m)  Wt 105 lb 9.6 oz (47.9 kg)  BMI 20.28 kg/m2  SpO2 100%   HENT:  Head: Normocephalic and atraumatic.  Right Ear: Hearing, tympanic membrane, external ear and ear canal normal.  Left Ear: Hearing, tympanic membrane, external ear and ear canal normal.  Nose: Nose normal.  Mouth/Throat: Uvula is midline, oropharynx is clear and moist and mucous membranes are normal.  Eyes: Conjunctivae are  normal.  Neck: Normal range of motion.  Cardiovascular: Normal rate, regular rhythm and normal heart sounds.   Pulmonary/Chest: Effort normal and breath sounds normal. She has no wheezes.  Lymphadenopathy:       Head (right side): No tonsillar and no occipital adenopathy present.       Head (left side): No tonsillar and no occipital adenopathy present.    She has no cervical adenopathy.       Right: No supraclavicular adenopathy present.       Left: No supraclavicular adenopathy present.   Results for orders placed or performed in visit on 06/06/14  POCT rapid strep A  Result Value Ref Range   Rapid Strep A Screen Negative Negative       Assessment & Plan:  Sore throat - Plan: POCT rapid strep A, Culture, Group A Strep   Symptomatic care while waiting on throat culture.  Windell Hummingbird PA-C  [facility] 06/06/2014 2:55 PM

## 2014-06-08 LAB — CULTURE, GROUP A STREP: Organism ID, Bacteria: NORMAL

## 2014-06-29 ENCOUNTER — Other Ambulatory Visit: Payer: Self-pay | Admitting: Obstetrics and Gynecology

## 2014-06-30 LAB — CYTOLOGY - PAP

## 2015-03-17 IMAGING — MG MM DIAGNOSTIC UNILATERAL L
2 series · 2 of 2 positions shown · non-contrast
Comparison: 06/23/2013 and earlier priors

CLINICAL DATA: Possible mass left breast identified on recent
screening mammogram.

EXAM:
DIGITAL DIAGNOSTIC  LEFT MAMMOGRAM
ULTRASOUND she is 2 BREAST

[L CC]
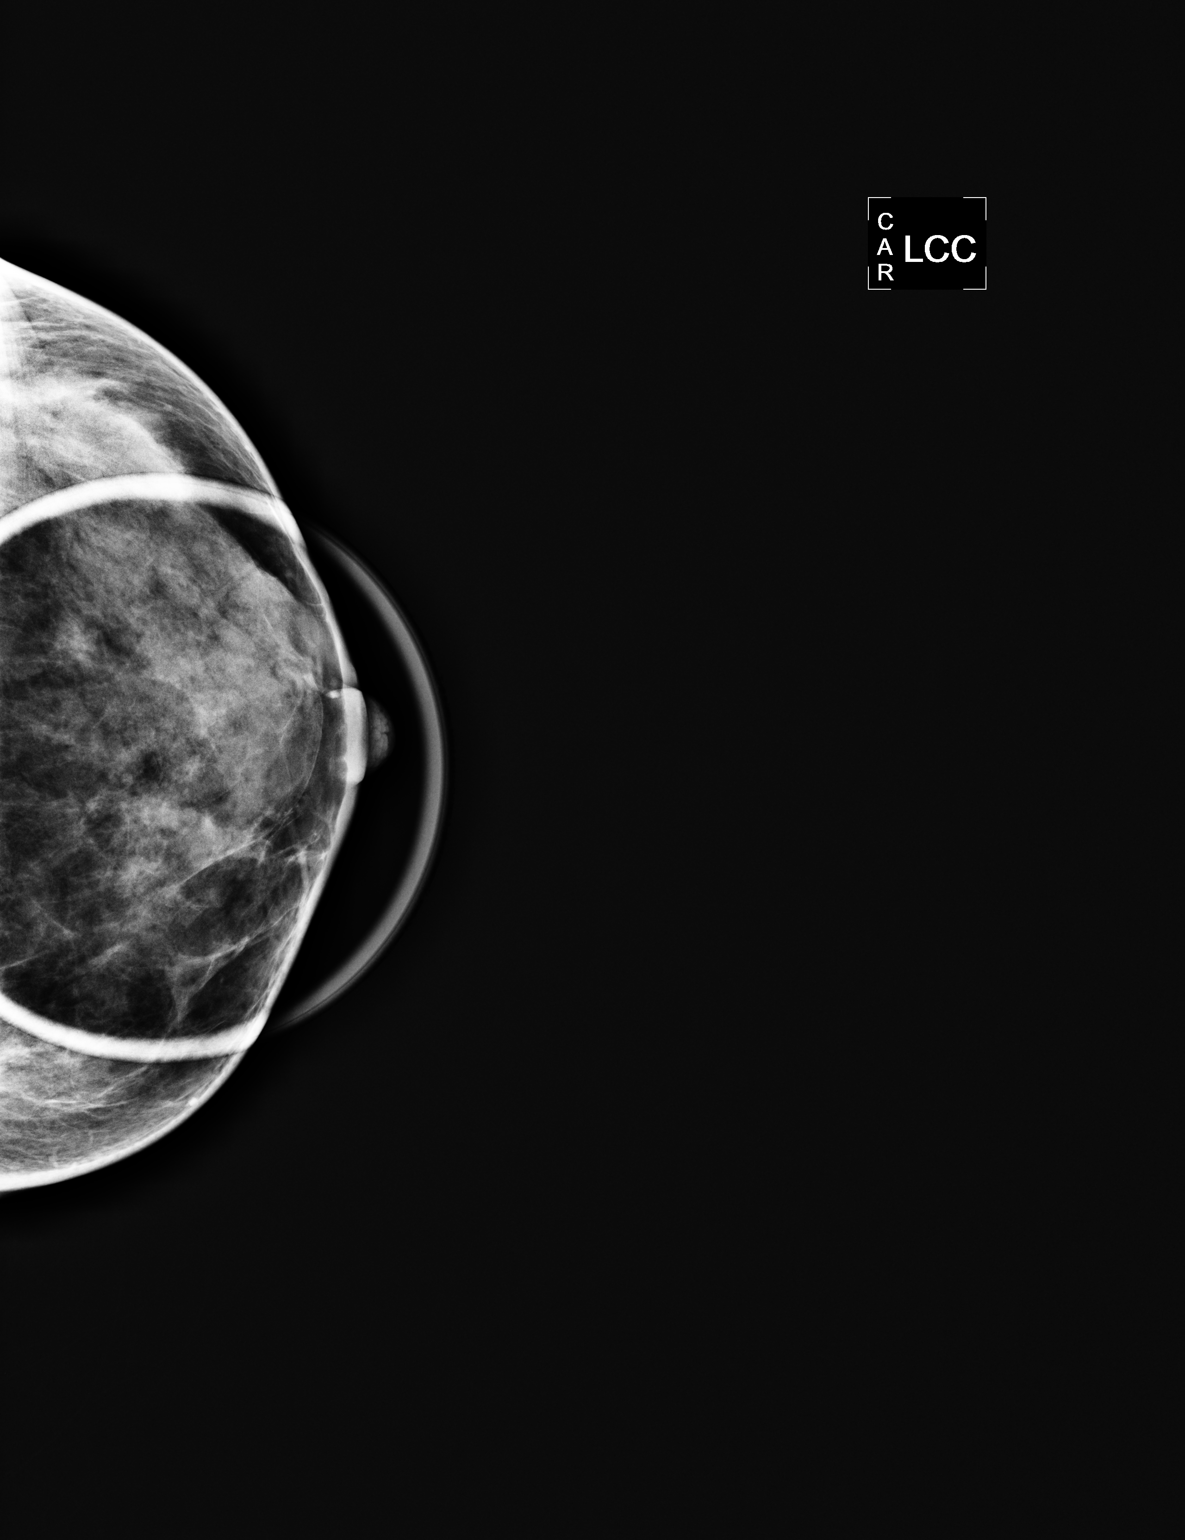

[L MLO]
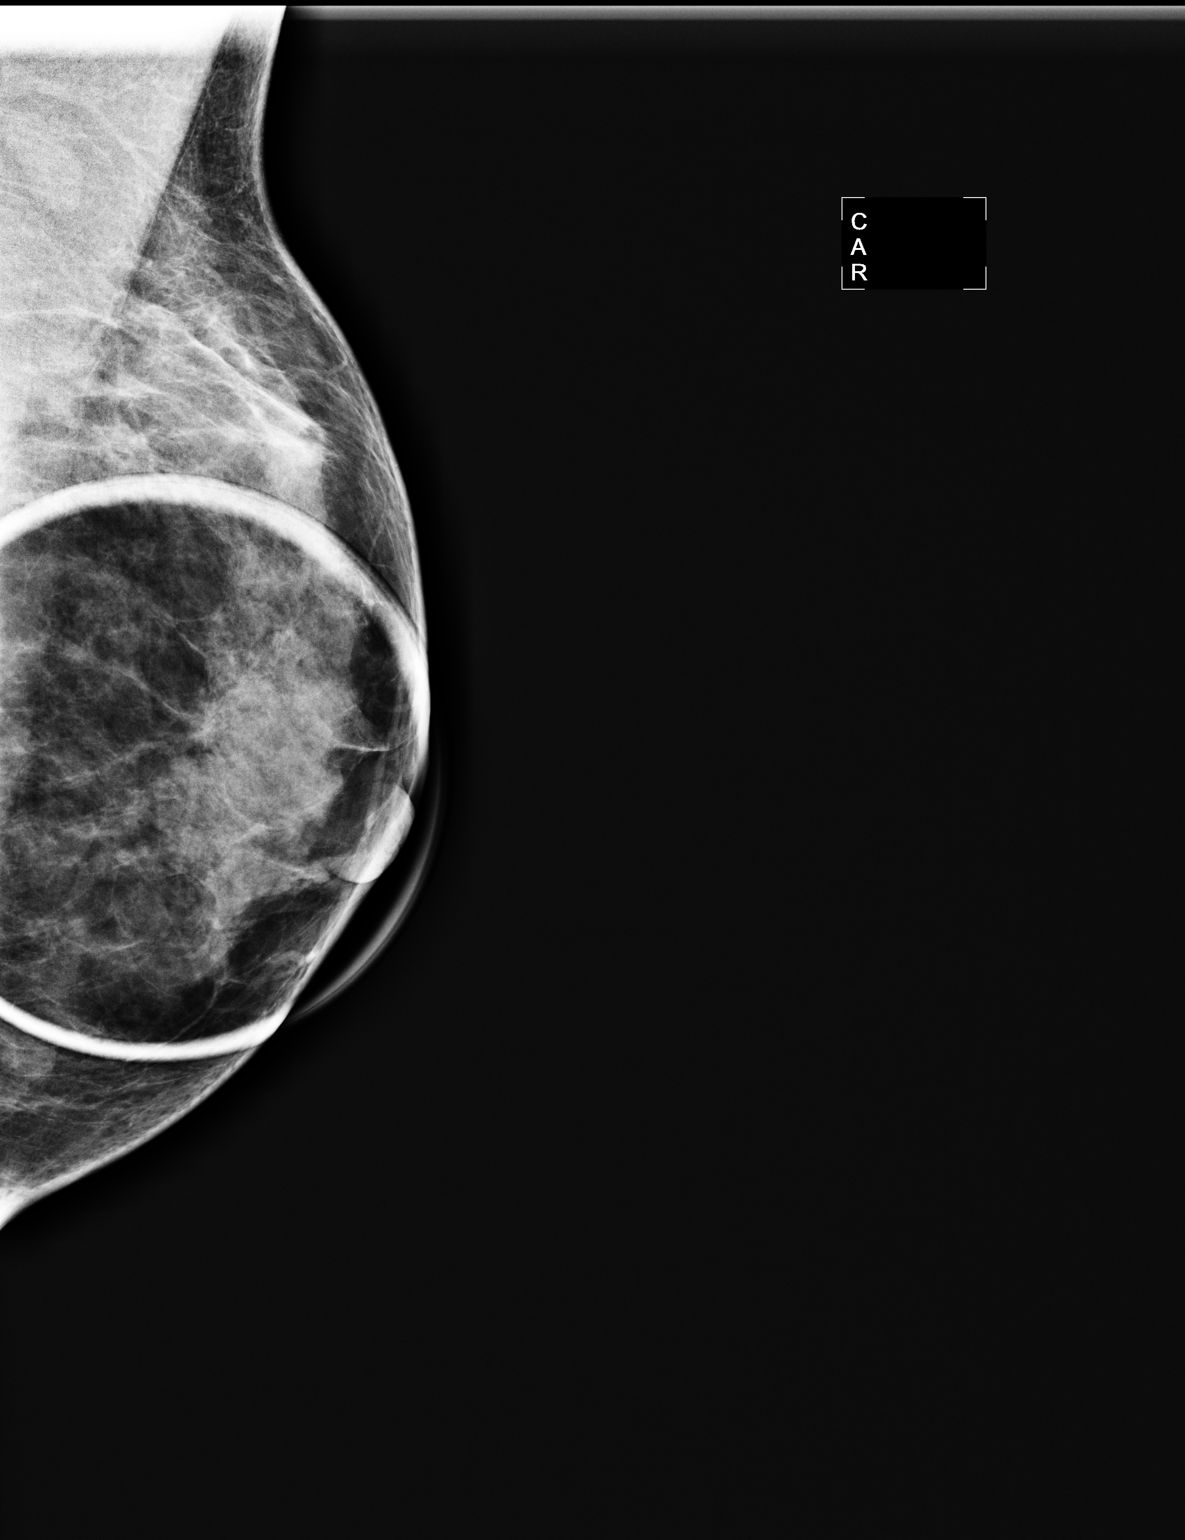

[2 of 2 positions shown; findings below may reference images not displayed]

ACR Breast Density Category c: The breast tissue is heterogeneously
dense, which may obscure small masses.
FINDINGS: Focal spot compression views of the subareolar left breast show no
suspicious mass or distortion. The breast tissue is heterogeneously
dense in this region.

On physical exam, no mass is palpated in the left breast.

Ultrasound is performed, showing normal/benign breast tissue. No
sonographic findings to suggest malignancy.
IMPRESSION: No evidence of malignancy in the left breast.

RECOMMENDATION:
Screening mammogram in one year.(Code:2L-2-25A)

I have discussed the findings and recommendations with the patient.
Results were also provided in writing at the conclusion of the
visit. If applicable, a reminder letter will be sent to the patient
regarding the next appointment.

BI-RADS CATEGORY  1: Negative.

## 2016-02-07 ENCOUNTER — Other Ambulatory Visit: Payer: Self-pay | Admitting: Family Medicine

## 2016-02-07 DIAGNOSIS — IMO0002 Reserved for concepts with insufficient information to code with codable children: Secondary | ICD-10-CM

## 2016-02-07 DIAGNOSIS — R229 Localized swelling, mass and lump, unspecified: Principal | ICD-10-CM

## 2016-02-13 ENCOUNTER — Ambulatory Visit
Admission: RE | Admit: 2016-02-13 | Discharge: 2016-02-13 | Disposition: A | Discharge status: Home / Self Care | Payer: Commercial Managed Care - HMO | Source: Ambulatory Visit | Attending: Family Medicine | Admitting: Family Medicine

## 2016-02-13 DIAGNOSIS — R229 Localized swelling, mass and lump, unspecified: Principal | ICD-10-CM

## 2016-02-13 DIAGNOSIS — IMO0002 Reserved for concepts with insufficient information to code with codable children: Secondary | ICD-10-CM

## 2016-04-23 DIAGNOSIS — Z23 Encounter for immunization: Secondary | ICD-10-CM | POA: Diagnosis not present

## 2016-07-09 DIAGNOSIS — Z6821 Body mass index (BMI) 21.0-21.9, adult: Secondary | ICD-10-CM | POA: Diagnosis not present

## 2016-07-09 DIAGNOSIS — Z1212 Encounter for screening for malignant neoplasm of rectum: Secondary | ICD-10-CM | POA: Diagnosis not present

## 2016-07-09 DIAGNOSIS — Z01419 Encounter for gynecological examination (general) (routine) without abnormal findings: Secondary | ICD-10-CM | POA: Diagnosis not present

## 2016-10-29 DIAGNOSIS — Z0000 Encounter for general adult medical examination without abnormal findings: Secondary | ICD-10-CM | POA: Diagnosis not present

## 2016-11-01 DIAGNOSIS — Z0000 Encounter for general adult medical examination without abnormal findings: Secondary | ICD-10-CM | POA: Diagnosis not present

## 2016-12-04 DIAGNOSIS — Z23 Encounter for immunization: Secondary | ICD-10-CM | POA: Diagnosis not present

## 2017-07-16 DIAGNOSIS — Z6821 Body mass index (BMI) 21.0-21.9, adult: Secondary | ICD-10-CM | POA: Diagnosis not present

## 2017-07-16 DIAGNOSIS — Z01419 Encounter for gynecological examination (general) (routine) without abnormal findings: Secondary | ICD-10-CM | POA: Diagnosis not present

## 2017-07-18 DIAGNOSIS — D2361 Other benign neoplasm of skin of right upper limb, including shoulder: Secondary | ICD-10-CM | POA: Diagnosis not present

## 2017-07-18 DIAGNOSIS — D2230 Melanocytic nevi of unspecified part of face: Secondary | ICD-10-CM | POA: Diagnosis not present

## 2017-07-18 DIAGNOSIS — D2271 Melanocytic nevi of right lower limb, including hip: Secondary | ICD-10-CM | POA: Diagnosis not present

## 2017-10-08 DIAGNOSIS — Z6821 Body mass index (BMI) 21.0-21.9, adult: Secondary | ICD-10-CM | POA: Diagnosis not present

## 2017-11-18 DIAGNOSIS — E559 Vitamin D deficiency, unspecified: Secondary | ICD-10-CM | POA: Diagnosis not present

## 2017-11-18 DIAGNOSIS — Z0000 Encounter for general adult medical examination without abnormal findings: Secondary | ICD-10-CM | POA: Diagnosis not present

## 2017-11-20 DIAGNOSIS — Z6821 Body mass index (BMI) 21.0-21.9, adult: Secondary | ICD-10-CM | POA: Diagnosis not present

## 2017-11-20 DIAGNOSIS — Z23 Encounter for immunization: Secondary | ICD-10-CM | POA: Diagnosis not present

## 2017-11-20 DIAGNOSIS — Z0000 Encounter for general adult medical examination without abnormal findings: Secondary | ICD-10-CM | POA: Diagnosis not present

## 2017-12-30 DIAGNOSIS — R5383 Other fatigue: Secondary | ICD-10-CM | POA: Diagnosis not present

## 2018-02-14 DIAGNOSIS — E559 Vitamin D deficiency, unspecified: Secondary | ICD-10-CM | POA: Diagnosis not present

## 2018-02-19 DIAGNOSIS — E559 Vitamin D deficiency, unspecified: Secondary | ICD-10-CM | POA: Diagnosis not present

## 2018-04-02 DIAGNOSIS — R5383 Other fatigue: Secondary | ICD-10-CM | POA: Diagnosis not present

## 2018-08-06 DIAGNOSIS — Z01419 Encounter for gynecological examination (general) (routine) without abnormal findings: Secondary | ICD-10-CM | POA: Diagnosis not present

## 2018-08-06 DIAGNOSIS — Z6822 Body mass index (BMI) 22.0-22.9, adult: Secondary | ICD-10-CM | POA: Diagnosis not present

## 2018-10-07 ENCOUNTER — Other Ambulatory Visit: Payer: Self-pay

## 2018-10-07 DIAGNOSIS — Z20822 Contact with and (suspected) exposure to covid-19: Secondary | ICD-10-CM

## 2018-10-09 LAB — NOVEL CORONAVIRUS, NAA: SARS-CoV-2, NAA: NOT DETECTED

## 2018-10-29 ENCOUNTER — Ambulatory Visit: Payer: 59 | Admitting: Gastroenterology

## 2018-10-29 ENCOUNTER — Encounter: Payer: Self-pay | Admitting: Gastroenterology

## 2018-10-29 VITALS — BP 100/60 | HR 72 | Temp 98.2°F | Ht 65.0 in | Wt 124.4 lb

## 2018-10-29 DIAGNOSIS — Z1589 Genetic susceptibility to other disease: Secondary | ICD-10-CM

## 2018-10-29 NOTE — Progress Notes (Signed)
HPI: This is a very pleasant 43 year old woman who was referred to me by Dian Queen, MD  to evaluate recent genetic testing.    Chief complaint is abnormal genetic test  She underwent genetic testing through her gynecologist, [facility] risk panel.  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  The attached company literature states that she is at somewhat increased risk for colon cancer.  There data state risk [place] population is 3% whereas with her mutation she is at 3.4 to 10% risk.  There is also a section in the literature that states "currently there are no medical management guidelines for colorectal cancer risk in patients with a single MUTHY mutation unless colorectal cancer has been diagnosed in a first-degree relative.  This is based on NCCN guidelines.  She does not have any first-degree relatives with colon cancer.  She does have a second-degree relative with colon cancer, an uncle of hers.  I reviewed the most recent NCCN guidelines and it is clear that with a heterozygous, single galeal mutation of this gene and no first-degree relatives with colon cancer she would be considered at routine risk for colon cancer and should be screened appropriately for routine risk patients  She has no trouble with her bowels.  She has been getting annual Hemoccults through her gynecologist.  She has gained 10 to 15 pounds in the past 6 months.  About once a year for the past many many years she will have scratchy perianal pain and a small amount of blood that day.  Review of systems: Pertinent positive and negative review of systems were noted in the above HPI section. All other review negative.   Past Medical History:  Diagnosis Date  . Wears glasses     Past Surgical History:  Procedure Laterality Date  . CESAREAN SECTION  04/25/2000  . CESAREAN SECTION  09/09/2007  . NO PAST SURGERIES    . OPEN REDUCTION INTERNAL FIXATION (ORIF) PROXIMAL  PHALANX Right 08/05/2013   Procedure: OPEN REDUCTION INTERNAL FIXATION (ORIF) PROXIMAL PHALANX RIGHT MIDDLE FINGER;  Surgeon: Wynonia Sours, MD;  Location: [place] [facility];  Service: Orthopedics;  Laterality: Right;    Current Outpatient Medications  Medication Sig Dispense Refill  . buPROPion (ZYBAN) 150 MG 12 hr tablet Take 150 mg by mouth daily.    Marland Kitchen escitalopram (LEXAPRO) 10 MG tablet Take 10 mg by mouth daily.    Marland Kitchen levonorgestrel (MIRENA) 20 MCG/24HR IUD 1 each by Intrauterine route once.     No current facility-administered medications for this visit.     Allergies as of 10/29/2018 - Review Complete 10/29/2018  Allergen Reaction Noted  . Sulfa antibiotics Rash 08/03/2013    Family History  Problem Relation Age of Onset  . Osteoarthritis Mother   . Diverticulitis Mother   . Pancreatic cancer Mother   . Heart disease Father   . Hyperlipidemia Father   . Hypertension Father   . Migraines Sister   . Arthritis Sister   . Migraines Brother     Social History   Socioeconomic History  . Marital status: Married    Spouse name: Not on file  . Number of children: 3  . Years of education: Not on file  . Highest education level: Not on file  Occupational History  . Not on file  Social Needs  . Financial resource strain: Not on file  . Food insecurity    Worry: Not on file  Inability: Not on file  . Transportation needs    Medical: Not on file    Non-medical: Not on file  Tobacco Use  . Smoking status: Never Smoker  . Smokeless tobacco: Never Used  Substance and Sexual Activity  . Alcohol use: No  . Drug use: No  . Sexual activity: Not on file  Lifestyle  . Physical activity    Days per week: Not on file    Minutes per session: Not on file  . Stress: Not on file  Relationships  . Social Herbalist on phone: Not on file    Gets together: Not on file    Attends religious service: Not on file    Active member of club or organization: Not on  file    Attends meetings of clubs or organizations: Not on file    Relationship status: Married  . Intimate partner violence    Fear of current or ex partner: Not on file    Emotionally abused: Not on file    Physically abused: Not on file    Forced sexual activity: Not on file  Other Topics Concern  . Not on file  Social History Narrative  . Not on file     Physical Exam: BP 100/60   Pulse 72   Temp 98.2 F (36.8 C) (Oral)   Ht '5\' 5"'  (1.651 m)   Wt 124 lb 6.4 oz (56.4 kg)   BMI 20.70 kg/m  Constitutional: generally well-appearing Psychiatric: alert and oriented x3 Eyes: extraocular movements intact Mouth: oral pharynx moist, no lesions Neck: supple no lymphadenopathy Cardiovascular: heart regular rate and rhythm Lungs: clear to auscultation bilaterally Abdomen: soft, nontender, nondistended, no obvious ascites, no peritoneal signs, normal bowel sounds Extremities: no lower extremity edema bilaterally Skin: no lesions on visible extremities   Assessment and plan: 43 y.o. female with mono allelic MUTYH mutation  She underwent genetic testing through her gynecologist, [facility] my risk panel.  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  The attached company literature states that she is at somewhat increased risk for colon cancer.  There data state risk [place] population is 3% whereas with her mutation she is at 3.4 to 10% risk.  There is also a section in the literature that states "currently there are no medical management guidelines for colorectal cancer risk in patients with a single MUTHY mutation unless colorectal cancer has been diagnosed in a first-degree relative.  This is based on NCCN guidelines.  She does not have any first-degree relatives with colon cancer.  She does have a second-degree relative with colon cancer, an uncle of hers.  I reviewed the most recent NCCN guidelines and it is clear that with a heterozygous, single  galeal mutation of this gene and no first-degree relatives with colon cancer she would be considered at routine risk for colon cancer and should be screened appropriately for routine risk patients  She does have very minor intermittent anal rectal bleeding about once a year for the past 8 to 10 years.  This is associated with some minor perianal pain.  I think it is extremely unlikely that this is due to anything neoplastic.  I recommended that she began colon cancer screening with a colonoscopy at age 36 and that if it is normal she would not need colon cancer screening and for 10 years.  We had a very nice discussion about this she seems to understand the nuances involved  and completely agree.  Please see the "Patient Instructions" section for addition details about the plan.   Owens Loffler, MD [facility] 10/29/2018, 1:42 PM  Cc: Dian Queen, MD

## 2018-10-29 NOTE — Patient Instructions (Signed)
You will be due for a colonoscopy  March 2025 for colon cancer screening. You will be contacted by our office around that date.  Thank you for entrusting me with your care and choosing [facility].  Dr Ardis Hughs

## 2019-07-22 ENCOUNTER — Ambulatory Visit
Admission: RE | Admit: 2019-07-22 | Discharge: 2019-07-22 | Disposition: A | Discharge status: Home / Self Care | Payer: 59 | Source: Ambulatory Visit | Attending: Family Medicine | Admitting: Family Medicine

## 2019-07-22 ENCOUNTER — Other Ambulatory Visit: Payer: Self-pay

## 2019-07-22 ENCOUNTER — Other Ambulatory Visit: Payer: Self-pay | Admitting: Family Medicine

## 2019-07-22 DIAGNOSIS — R0602 Shortness of breath: Secondary | ICD-10-CM

## 2019-08-04 ENCOUNTER — Other Ambulatory Visit (INDEPENDENT_AMBULATORY_CARE_PROVIDER_SITE_OTHER): Payer: Self-pay | Admitting: Neurology

## 2019-08-04 NOTE — Telephone Encounter (Signed)
??  never seen here

## 2019-08-18 ENCOUNTER — Other Ambulatory Visit (INDEPENDENT_AMBULATORY_CARE_PROVIDER_SITE_OTHER): Payer: Self-pay | Admitting: Neurology

## 2019-09-24 ENCOUNTER — Other Ambulatory Visit: Payer: Self-pay | Admitting: Obstetrics and Gynecology

## 2019-09-24 DIAGNOSIS — R928 Other abnormal and inconclusive findings on diagnostic imaging of breast: Secondary | ICD-10-CM

## 2019-10-07 ENCOUNTER — Other Ambulatory Visit: Payer: Self-pay

## 2019-10-07 ENCOUNTER — Ambulatory Visit
Admission: RE | Admit: 2019-10-07 | Discharge: 2019-10-07 | Disposition: A | Discharge status: Home / Self Care | Payer: 59 | Source: Ambulatory Visit | Attending: Obstetrics and Gynecology | Admitting: Obstetrics and Gynecology

## 2019-10-07 ENCOUNTER — Ambulatory Visit: Payer: 59

## 2019-10-07 DIAGNOSIS — R928 Other abnormal and inconclusive findings on diagnostic imaging of breast: Secondary | ICD-10-CM

## 2020-04-04 ENCOUNTER — Ambulatory Visit
Admission: RE | Admit: 2020-04-04 | Discharge: 2020-04-04 | Disposition: A | Discharge status: Home / Self Care | Payer: 59 | Source: Ambulatory Visit | Attending: Family Medicine | Admitting: Family Medicine

## 2020-04-04 ENCOUNTER — Other Ambulatory Visit: Payer: Self-pay

## 2020-04-04 ENCOUNTER — Other Ambulatory Visit: Payer: Self-pay | Admitting: Family Medicine

## 2020-04-04 DIAGNOSIS — R52 Pain, unspecified: Secondary | ICD-10-CM

## 2022-08-16 ENCOUNTER — Other Ambulatory Visit: Payer: Self-pay | Admitting: Family Medicine

## 2022-08-16 DIAGNOSIS — Z1231 Encounter for screening mammogram for malignant neoplasm of breast: Secondary | ICD-10-CM

## 2022-09-18 ENCOUNTER — Ambulatory Visit: Payer: PRIVATE HEALTH INSURANCE

## 2023-04-01 DIAGNOSIS — Z13220 Encounter for screening for lipoid disorders: Secondary | ICD-10-CM | POA: Diagnosis not present

## 2023-04-01 DIAGNOSIS — Z0000 Encounter for general adult medical examination without abnormal findings: Secondary | ICD-10-CM | POA: Diagnosis not present

## 2023-04-05 DIAGNOSIS — E785 Hyperlipidemia, unspecified: Secondary | ICD-10-CM | POA: Diagnosis not present

## 2023-04-05 DIAGNOSIS — Z0000 Encounter for general adult medical examination without abnormal findings: Secondary | ICD-10-CM | POA: Diagnosis not present

## 2023-06-14 DIAGNOSIS — Z124 Encounter for screening for malignant neoplasm of cervix: Secondary | ICD-10-CM | POA: Diagnosis not present

## 2023-06-14 DIAGNOSIS — N951 Menopausal and female climacteric states: Secondary | ICD-10-CM | POA: Diagnosis not present

## 2023-06-14 DIAGNOSIS — Z6822 Body mass index (BMI) 22.0-22.9, adult: Secondary | ICD-10-CM | POA: Diagnosis not present

## 2023-06-14 DIAGNOSIS — Z1231 Encounter for screening mammogram for malignant neoplasm of breast: Secondary | ICD-10-CM | POA: Diagnosis not present

## 2023-06-14 DIAGNOSIS — Z01419 Encounter for gynecological examination (general) (routine) without abnormal findings: Secondary | ICD-10-CM | POA: Diagnosis not present

## 2023-07-26 DIAGNOSIS — Z13820 Encounter for screening for osteoporosis: Secondary | ICD-10-CM | POA: Diagnosis not present

## 2023-08-07 DIAGNOSIS — F331 Major depressive disorder, recurrent, moderate: Secondary | ICD-10-CM | POA: Diagnosis not present

## 2023-08-07 DIAGNOSIS — G4700 Insomnia, unspecified: Secondary | ICD-10-CM | POA: Diagnosis not present

## 2023-08-07 DIAGNOSIS — F411 Generalized anxiety disorder: Secondary | ICD-10-CM | POA: Diagnosis not present

## 2023-08-07 DIAGNOSIS — J309 Allergic rhinitis, unspecified: Secondary | ICD-10-CM | POA: Diagnosis not present

## 2023-08-07 DIAGNOSIS — Z79899 Other long term (current) drug therapy: Secondary | ICD-10-CM | POA: Diagnosis not present

## 2024-01-21 DIAGNOSIS — E559 Vitamin D deficiency, unspecified: Secondary | ICD-10-CM | POA: Diagnosis not present

## 2024-01-21 DIAGNOSIS — R5383 Other fatigue: Secondary | ICD-10-CM | POA: Diagnosis not present

## 2024-01-23 DIAGNOSIS — F331 Major depressive disorder, recurrent, moderate: Secondary | ICD-10-CM | POA: Diagnosis not present

## 2024-01-23 DIAGNOSIS — G4700 Insomnia, unspecified: Secondary | ICD-10-CM | POA: Diagnosis not present

## 2024-01-23 DIAGNOSIS — E559 Vitamin D deficiency, unspecified: Secondary | ICD-10-CM | POA: Diagnosis not present

## 2024-01-23 DIAGNOSIS — F411 Generalized anxiety disorder: Secondary | ICD-10-CM | POA: Diagnosis not present

## 2024-01-24 DIAGNOSIS — Z23 Encounter for immunization: Secondary | ICD-10-CM | POA: Diagnosis not present

## 2024-01-24 DIAGNOSIS — Z7185 Encounter for immunization safety counseling: Secondary | ICD-10-CM | POA: Diagnosis not present

## 2024-02-24 DIAGNOSIS — Z79890 Hormone replacement therapy (postmenopausal): Secondary | ICD-10-CM | POA: Diagnosis not present

## 2024-02-24 DIAGNOSIS — G4700 Insomnia, unspecified: Secondary | ICD-10-CM | POA: Diagnosis not present

## 2024-02-24 DIAGNOSIS — F331 Major depressive disorder, recurrent, moderate: Secondary | ICD-10-CM | POA: Diagnosis not present

## 2024-02-24 DIAGNOSIS — F411 Generalized anxiety disorder: Secondary | ICD-10-CM | POA: Diagnosis not present
